# Patient Record
Sex: Female | Born: 1995 | Race: White | Hispanic: No | Marital: Married | Smoking: Never smoker
Health system: Southern US, Community
[De-identification: ages and names within clinical notes are randomized; demographics above are authoritative.]

## PROBLEM LIST (undated history)

## (undated) DIAGNOSIS — I10 Essential (primary) hypertension: Secondary | ICD-10-CM

## (undated) DIAGNOSIS — K219 Gastro-esophageal reflux disease without esophagitis: Secondary | ICD-10-CM

## (undated) HISTORY — PX: PANCREATECTOMY: SHX1019

## (undated) HISTORY — PX: CHOLECYSTECTOMY: SHX55

---
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
## 2018-01-23 ENCOUNTER — Ambulatory Visit (HOSPITAL_COMMUNITY)
Admission: EM | Admit: 2018-01-23 | Discharge: 2018-01-23 | Disposition: A | Discharge status: Home / Self Care | Attending: Internal Medicine | Admitting: Internal Medicine

## 2018-01-23 ENCOUNTER — Encounter (HOSPITAL_COMMUNITY): Payer: Self-pay | Admitting: Emergency Medicine

## 2018-01-23 DIAGNOSIS — K921 Melena: Secondary | ICD-10-CM

## 2018-01-23 DIAGNOSIS — R112 Nausea with vomiting, unspecified: Secondary | ICD-10-CM | POA: Diagnosis not present

## 2018-01-23 DIAGNOSIS — M7918 Myalgia, other site: Secondary | ICD-10-CM | POA: Diagnosis not present

## 2018-01-23 LAB — POCT I-STAT, CHEM 8
BUN: 15 mg/dL (ref 6–20)
CALCIUM ION: 1.28 mmol/L (ref 1.15–1.40)
Chloride: 108 mmol/L (ref 98–111)
Creatinine, Ser: 0.9 mg/dL (ref 0.44–1.00)
Glucose, Bld: 94 mg/dL (ref 70–99)
HCT: 41 % (ref 36.0–46.0)
Hemoglobin: 13.9 g/dL (ref 12.0–15.0)
Potassium: 3.2 mmol/L — ABNORMAL LOW (ref 3.5–5.1)
SODIUM: 139 mmol/L (ref 135–145)
TCO2: 23 mmol/L (ref 22–32)

## 2018-01-23 LAB — OCCULT BLOOD, POC DEVICE: FECAL OCCULT BLD: NEGATIVE

## 2018-01-23 MED ORDER — SODIUM CHLORIDE 0.9 % IV BOLUS
1000.0000 mL | Freq: Once | INTRAVENOUS | Status: AC
  Administered 2018-01-23: 1000 mL via INTRAVENOUS

## 2018-01-23 MED ORDER — ONDANSETRON HCL 4 MG/2ML IJ SOLN
4.0000 mg | Freq: Once | INTRAMUSCULAR | Status: AC
  Administered 2018-01-23: 4 mg via INTRAVENOUS

## 2018-01-23 MED ORDER — ONDANSETRON 4 MG PO TBDP: 4.0000 mg | ORAL_TABLET | Freq: Three times a day (TID) | ORAL | 0 refills | Status: AC | PRN

## 2018-01-23 MED ORDER — ONDANSETRON HCL 4 MG/2ML IJ SOLN
INTRAMUSCULAR | Status: AC
  Filled 2018-01-23: qty 2

## 2018-01-23 NOTE — ED Triage Notes (Signed)
Pt c/o cold/flu sx onset today around 0130  Sx include: body aches, chills, vomiting and nauseas, weakness, fevers  Denies cough, runny nose, congestion   Last had dayquil and acetaminophen at 0700 today.   Pt is breastfeeding 768 week old baby   A&O x4... NAD.Marland Kitchen.. ambulatory

## 2018-01-23 NOTE — Discharge Instructions (Addendum)
Your blood work was normal. Zofran for nausea and vomiting as needed. Keep hydrated, you urine should be clear to pale yellow in color. Bland diet, advance as tolerated. Monitor for any worsening of symptoms, nausea or vomiting not controlled by medication, abdominal pain, unwilling to jump due to abdominal pain, lightheaded, passing out, go to the emergency department for further evaluation.

## 2018-01-23 NOTE — ED Provider Notes (Signed)
[facility]    CSN: [id number] Arrival date & time: 01/23/18  0809     History   Chief Complaint Chief Complaint  Patient presents with  . URI    HPI Morgan Knight is a 22 y.o. female.   22 year old female comes in for acute onset of nausea, vomiting, subjective fever, body aches, chills. She had 2 episodes of nonbilious nonbloody vomit. Denies URI symptoms such as cough, congestion, rhinorrhea, sore throat. Denies abdominal pain, diarrhea. States for the past 1 week, has seen blood in the toilet bowl during BMs. Denies painful BM, melena. Mild straining during BM. She has not tolerated oral intake since last emesis episode. She was able to take dayquil and tylenol about 7am this morning, and kept the medications down. Patient breastfeeding 58 week old baby.   She denies chest pain, shortness of breath. Occasional palpitations since symptoms started. She has felt lightheaded without dizziness, one sided weakness, syncope.      History reviewed. No pertinent past medical history.  There are no active problems to display for this patient.   History reviewed. No pertinent surgical history.  OB History   No obstetric history on file.      Home Medications    Prior to Admission medications   Medication Sig Start Date End Date Taking? Authorizing Provider  ondansetron (ZOFRAN ODT) 4 MG disintegrating tablet Take 1 tablet (4 mg total) by mouth every 8 (eight) hours as needed for nausea or vomiting. 01/23/18   Belinda FisherYu, Amy V, PA-C    Family History History reviewed. No pertinent family history.  Social History Social History   Tobacco Use  . Smoking status: Never Smoker  . Smokeless tobacco: Never Used  Substance Use Topics  . Alcohol use: Never    Frequency: Never  . Drug use: Never     Allergies   Patient has no known allergies.   Review of Systems Review of Systems  Reason unable to perform ROS: See HPI as above.     Physical Exam Triage  Vital Signs ED Triage Vitals  Enc Vitals Group     BP 01/23/18 0839 (!) 99/58     Pulse Rate 01/23/18 0838 (!) 115     Resp --      Temp 01/23/18 0838 100.2 F (37.9 C)     Temp Source 01/23/18 0838 Oral     SpO2 01/23/18 0839 98 %     Weight --      Height --      Head Circumference --      Peak Flow --      Pain Score 01/23/18 0841 6     Pain Loc --      Pain Edu? --      Excl. in GC? --    No data found.  Updated Vital Signs BP (!) 105/54 (BP Location: Left Arm)   Pulse 97   Temp 98.8 F (37.1 C) (Oral)   Resp 16   LMP 03/10/2017   SpO2 97%    Physical Exam Constitutional:      General: She is not in acute distress.    Appearance: She is well-developed.  HENT:     Head: Normocephalic and atraumatic.     Right Ear: Tympanic membrane, ear canal and external ear normal. Tympanic membrane is not erythematous or bulging.     Left Ear: Tympanic membrane, ear canal and external ear normal. Tympanic membrane is not erythematous or bulging.  Nose: Nose normal.     Right Sinus: No maxillary sinus tenderness or frontal sinus tenderness.     Left Sinus: No maxillary sinus tenderness or frontal sinus tenderness.     Mouth/Throat:     Pharynx: Uvula midline.  Eyes:     Conjunctiva/sclera: Conjunctivae normal.     Pupils: Pupils are equal, round, and reactive to light.  Neck:     Musculoskeletal: Normal range of motion and neck supple.  Cardiovascular:     Rate and Rhythm: Regular rhythm. Tachycardia present.     Heart sounds: Normal heart sounds. No murmur. No friction rub. No gallop.   Pulmonary:     Effort: Pulmonary effort is normal. No respiratory distress.     Breath sounds: Normal breath sounds. No stridor. No decreased breath sounds, wheezing, rhonchi or rales.  Abdominal:     General: Bowel sounds are normal.     Palpations: Abdomen is soft.     Tenderness: There is no abdominal tenderness. There is no guarding or rebound.  Genitourinary:    Rectum: Normal.  Guaiac result negative. No tenderness, anal fissure or external hemorrhoid. Normal anal tone.  Lymphadenopathy:     Cervical: No cervical adenopathy.  Skin:    General: Skin is warm and dry.  Neurological:     Mental Status: She is alert and oriented to person, place, and time.  Psychiatric:        Behavior: Behavior normal.        Judgment: Judgment normal.      UC Treatments / Results  Labs (all labs ordered are listed, but only abnormal results are displayed) Labs Reviewed  POCT I-STAT, CHEM 8 - Abnormal; Notable for the following components:      Result Value   Potassium 3.2 (*)    All other components within normal limits  POC OCCULT BLOOD, ED  I-STAT CHEM 8, ED  OCCULT BLOOD, POC DEVICE    EKG None  Radiology No results found.  Procedures Procedures (including critical care time)  Medications Ordered in UC Medications  ondansetron (ZOFRAN) injection 4 mg (4 mg Intravenous Given 01/23/18 0920)  sodium chloride 0.9 % bolus 1,000 mL (1,000 mLs Intravenous New Bag/Given 01/23/18 0918)    Initial Impression / Assessment and Plan / UC Course  I have reviewed the triage vital signs and the nursing notes.  Pertinent labs & imaging results that were available during my care of the patient were reviewed by me and considered in my medical decision making (see chart for details).    Patient with improvement of nausea after zofran. Has not had any palpitations. Will continue fluids and recheck.   Patient with improved symptoms after bolus fluid.  Provide Zofran for nausea/vomiting.  Other symptomatic treatment discussed.  Push fluids.  Return precautions given.  Patient expresses understanding and agrees to plan.  Final Clinical Impressions(s) / UC Diagnoses   Final diagnoses:  Intractable vomiting with nausea, unspecified vomiting type    ED Prescriptions    Medication Sig Dispense Auth. Provider   ondansetron (ZOFRAN ODT) 4 MG disintegrating tablet Take 1 tablet  (4 mg total) by mouth every 8 (eight) hours as needed for nausea or vomiting. 20 tablet Threasa AlphaYu, Amy V, PA-C        Yu, Amy V, PA-C 01/23/18 1800

## 2021-04-12 ENCOUNTER — Encounter (HOSPITAL_COMMUNITY): Payer: Self-pay

## 2021-04-12 ENCOUNTER — Inpatient Hospital Stay (HOSPITAL_COMMUNITY)
Admission: AD | Admit: 2021-04-12 | Discharge: 2021-04-13 | Disposition: A | Discharge status: Home / Self Care | Attending: Obstetrics and Gynecology | Admitting: Obstetrics and Gynecology

## 2021-04-12 ENCOUNTER — Other Ambulatory Visit: Payer: Self-pay

## 2021-04-12 DIAGNOSIS — R1010 Upper abdominal pain, unspecified: Secondary | ICD-10-CM | POA: Diagnosis present

## 2021-04-12 DIAGNOSIS — O26893 Other specified pregnancy related conditions, third trimester: Secondary | ICD-10-CM | POA: Insufficient documentation

## 2021-04-12 DIAGNOSIS — Z3A35 35 weeks gestation of pregnancy: Secondary | ICD-10-CM | POA: Diagnosis not present

## 2021-04-12 DIAGNOSIS — R0781 Pleurodynia: Secondary | ICD-10-CM | POA: Diagnosis not present

## 2021-04-12 HISTORY — DX: Gastro-esophageal reflux disease without esophagitis: K21.9

## 2021-04-12 HISTORY — DX: Essential (primary) hypertension: I10

## 2021-04-12 LAB — URINALYSIS, ROUTINE W REFLEX MICROSCOPIC
Bilirubin Urine: NEGATIVE
Glucose, UA: 150 mg/dL — AB
Hgb urine dipstick: NEGATIVE
Ketones, ur: NEGATIVE mg/dL
Nitrite: NEGATIVE
Protein, ur: NEGATIVE mg/dL
Specific Gravity, Urine: 1.02 (ref 1.005–1.030)
pH: 6 (ref 5.0–8.0)

## 2021-04-12 LAB — CBC WITH DIFFERENTIAL/PLATELET
Abs Immature Granulocytes: 0.06 10*3/uL (ref 0.00–0.07)
Basophils Absolute: 0 10*3/uL (ref 0.0–0.1)
Basophils Relative: 0 %
Eosinophils Absolute: 0.1 10*3/uL (ref 0.0–0.5)
Eosinophils Relative: 1 %
HCT: 33.1 % — ABNORMAL LOW (ref 36.0–46.0)
Hemoglobin: 11.2 g/dL — ABNORMAL LOW (ref 12.0–15.0)
Immature Granulocytes: 1 %
Lymphocytes Relative: 23 %
Lymphs Abs: 1.7 10*3/uL (ref 0.7–4.0)
MCH: 30.2 pg (ref 26.0–34.0)
MCHC: 33.8 g/dL (ref 30.0–36.0)
MCV: 89.2 fL (ref 80.0–100.0)
Monocytes Absolute: 0.6 10*3/uL (ref 0.1–1.0)
Monocytes Relative: 8 %
Neutro Abs: 5 10*3/uL (ref 1.7–7.7)
Neutrophils Relative %: 67 %
Platelets: 230 10*3/uL (ref 150–400)
RBC: 3.71 MIL/uL — ABNORMAL LOW (ref 3.87–5.11)
RDW: 12.2 % (ref 11.5–15.5)
WBC: 7.5 10*3/uL (ref 4.0–10.5)
nRBC: 0 % (ref 0.0–0.2)

## 2021-04-12 MED ORDER — CYCLOBENZAPRINE HCL 5 MG PO TABS
10.0000 mg | ORAL_TABLET | Freq: Once | ORAL | Status: AC
  Administered 2021-04-12: 10 mg via ORAL
  Filled 2021-04-12: qty 2

## 2021-04-12 NOTE — MAU Note (Signed)
Morgan Knight Hildred Alamin is a 26 y.o. at Unknown here in MAU reporting: hx of cholestasis in previous pregnancy. States she has been itching on her stomach for about a week now, but it got worse today along with rib pain that has been on and off for a week, but more constant today "like a stick is poking me in my rib cage". Reports "spotty" vision for the past couple of hours. States baby's movement has decreased to twice a day over the past week as well. Denies VB or LOF.  ? ? EDD 05/12/2021, repeat c-section scheduled on 3/31.  ? ?Onset of complaint: 1 week  ?Pain score: 8 ?Vitals:  ? 04/12/21 2240  ?BP: 133/80  ?Pulse: 85  ?Resp: 18  ?Temp: 98.1 ?F (36.7 ?C)  ?SpO2: 99%  ?   ?FHT:147 ?Lab orders placed from triage: U/A ? ?

## 2021-04-12 NOTE — MAU Provider Note (Signed)
?History  ?  ? ?CSN: [id number] ? ?Arrival date and time: 04/12/21 2232 ? ? Event Date/Time  ? First Provider Initiated Contact with Patient 04/12/21 2312   ?  ? ?Chief Complaint  ?Patient presents with  ? Abdominal Pain  ?   ?  ? ?HPI ?Morgan Knight is a 26 y.o. G2P1 at [redacted]w[redacted]d who presents with right sided upper abdominal pain. She reports the pain has been going on for about a week. She rates the pain a 8/10 and has tried tylenol and massage with no relief. She reports nausea and diarrhea for 2 days. She denies any vomiting. Denies any sick contacts that she knows of. She denies any contractions, bleeding or leaking. Reports feeling normal fetal movement.  ? ?OB History   ? ? Gravida  ?2  ? Para  ?1  ? Term  ?1  ? Preterm  ?   ? AB  ?   ? Living  ?1  ?  ? ? SAB  ?   ? IAB  ?   ? Ectopic  ?   ? Multiple  ?   ? Live Births  ?1  ?   ?  ?  ? ? ?Past Medical History:  ?Diagnosis Date  ? GERD (gastroesophageal reflux disease)   ? Hypertension   ? ? ?Past Surgical History:  ?Procedure Laterality Date  ? CESAREAN SECTION    ? CHOLECYSTECTOMY    ? ? ?No family history on file. ? ?Social History  ? ?Tobacco Use  ? Smoking status: Never  ? Smokeless tobacco: Never  ?Substance Use Topics  ? Alcohol use: Never  ? Drug use: Never  ? ? ?Allergies: No Known Allergies ? ?Medications Prior to Admission  ?Medication Sig Dispense Refill Last Dose  ? doxylamine, Sleep, (UNISOM) 25 MG tablet Take 25 mg by mouth at bedtime as needed.   04/12/2021  ? Prenatal Vit-Fe Fumarate-FA (PRENATAL MULTIVITAMIN) TABS tablet Take 1 tablet by mouth daily at 12 noon.   04/12/2021  ? ondansetron (ZOFRAN ODT) 4 MG disintegrating tablet Take 1 tablet (4 mg total) by mouth every 8 (eight) hours as needed for nausea or vomiting. 20 tablet 0   ? ? ?Review of Systems  ?Constitutional: Negative.  Negative for fatigue and fever.  ?HENT: Negative.    ?Respiratory: Negative.  Negative for shortness of breath.   ?Cardiovascular: Negative.  Negative for  chest pain.  ?Gastrointestinal:  Positive for abdominal pain. Negative for constipation, diarrhea, nausea and vomiting.  ?Genitourinary: Negative.  Negative for dysuria, vaginal bleeding and vaginal discharge.  ?Neurological: Negative.  Negative for dizziness and headaches.  ?Physical Exam  ? ?Blood pressure 124/84, pulse 87, temperature 98.1 ?F (36.7 ?C), temperature source Oral, resp. rate 18, height 5\' 6"  (1.676 m), weight 85.3 kg, SpO2 99 %. ? ?Physical Exam ?Vitals and nursing note reviewed.  ?Constitutional:   ?   General: She is not in acute distress. ?   Appearance: She is well-developed.  ?HENT:  ?   Head: Normocephalic.  ?Eyes:  ?   Pupils: Pupils are equal, round, and reactive to light.  ?Cardiovascular:  ?   Rate and Rhythm: Normal rate and regular rhythm.  ?   Heart sounds: Normal heart sounds.  ?Pulmonary:  ?   Effort: Pulmonary effort is normal. No respiratory distress.  ?   Breath sounds: Normal breath sounds.  ?Abdominal:  ?   General: Bowel sounds are normal. There is no distension.  ?  Palpations: Abdomen is soft.  ?   Tenderness: There is no abdominal tenderness.  ?Skin: ?   General: Skin is warm and dry.  ?Neurological:  ?   Mental Status: She is alert and oriented to person, place, and time.  ?Psychiatric:     ?   Mood and Affect: Mood normal.     ?   Behavior: Behavior normal.     ?   Thought Content: Thought content normal.     ?   Judgment: Judgment normal.  ? ?Fetal Tracing: ? ?Baseline: 130 ?Variability: moderate ?Accels: 15x15 ?Decels: none ? ?Toco: none ? ? ?MAU Course  ?Procedures ?Results for orders placed or performed during the hospital encounter of 04/12/21 (from the past 24 hour(s))  ?Urinalysis, Routine w reflex microscopic Urine, Clean Catch     Status: Abnormal  ? Collection Time: 04/12/21 10:57 PM  ?Result Value Ref Range  ? Color, Urine YELLOW YELLOW  ? APPearance HAZY (A) CLEAR  ? Specific Gravity, Urine 1.020 1.005 - 1.030  ? pH 6.0 5.0 - 8.0  ? Glucose, UA 150 (A) NEGATIVE  mg/dL  ? Hgb urine dipstick NEGATIVE NEGATIVE  ? Bilirubin Urine NEGATIVE NEGATIVE  ? Ketones, ur NEGATIVE NEGATIVE mg/dL  ? Protein, ur NEGATIVE NEGATIVE mg/dL  ? Nitrite NEGATIVE NEGATIVE  ? Leukocytes,Ua LARGE (A) NEGATIVE  ? RBC / HPF 0-5 0 - 5 RBC/hpf  ? WBC, UA 21-50 0 - 5 WBC/hpf  ? Bacteria, UA RARE (A) NONE SEEN  ? Squamous Epithelial / LPF 11-20 0 - 5  ? Mucus PRESENT   ? Ca Oxalate Crys, UA PRESENT   ?CBC with Differential/Platelet     Status: Abnormal  ? Collection Time: 04/12/21 11:26 PM  ?Result Value Ref Range  ? WBC 7.5 4.0 - 10.5 K/uL  ? RBC 3.71 (L) 3.87 - 5.11 MIL/uL  ? Hemoglobin 11.2 (L) 12.0 - 15.0 g/dL  ? HCT 33.1 (L) 36.0 - 46.0 %  ? MCV 89.2 80.0 - 100.0 fL  ? MCH 30.2 26.0 - 34.0 pg  ? MCHC 33.8 30.0 - 36.0 g/dL  ? RDW 12.2 11.5 - 15.5 %  ? Platelets 230 150 - 400 K/uL  ? nRBC 0.0 0.0 - 0.2 %  ? Neutrophils Relative % 67 %  ? Neutro Abs 5.0 1.7 - 7.7 K/uL  ? Lymphocytes Relative 23 %  ? Lymphs Abs 1.7 0.7 - 4.0 K/uL  ? Monocytes Relative 8 %  ? Monocytes Absolute 0.6 0.1 - 1.0 K/uL  ? Eosinophils Relative 1 %  ? Eosinophils Absolute 0.1 0.0 - 0.5 K/uL  ? Basophils Relative 0 %  ? Basophils Absolute 0.0 0.0 - 0.1 K/uL  ? Immature Granulocytes 1 %  ? Abs Immature Granulocytes 0.06 0.00 - 0.07 K/uL  ?Comprehensive metabolic panel     Status: Abnormal  ? Collection Time: 04/12/21 11:26 PM  ?Result Value Ref Range  ? Sodium 136 135 - 145 mmol/L  ? Potassium 3.6 3.5 - 5.1 mmol/L  ? Chloride 109 98 - 111 mmol/L  ? CO2 21 (L) 22 - 32 mmol/L  ? Glucose, Bld 124 (H) 70 - 99 mg/dL  ? BUN <5 (L) 6 - 20 mg/dL  ? Creatinine, Ser 0.62 0.44 - 1.00 mg/dL  ? Calcium 8.1 (L) 8.9 - 10.3 mg/dL  ? Total Protein 5.4 (L) 6.5 - 8.1 g/dL  ? Albumin 2.4 (L) 3.5 - 5.0 g/dL  ? AST 19 15 - 41 U/L  ? ALT 15  0 - 44 U/L  ? Alkaline Phosphatase 142 (H) 38 - 126 U/L  ? Total Bilirubin 0.2 (L) 0.3 - 1.2 mg/dL  ? GFR, Estimated >60 >60 mL/min  ? Anion gap 6 5 - 15  ?  ? ?MDM ?Prenatal records from private office reviewed-  limited [facility] available from [facility]. Pregnancy complicated by [facility], previous c/s. ?Labs ordered and reviewed. ? ?UA, UC ?CBC with Diff ?CMP ?Flexeril PO- patient reports resolution of pain ? ?Assessment and Plan  ? ?1. Rib pain   ?2. [redacted] weeks gestation of pregnancy   ? ?-Discharge home in stable condition ?-Rx for flexeril sent to patient's pharmacy ?-Preterm labor precautions discussed ?-Patient advised to follow-up with OB as scheduled for prenatal care ?-Patient may return to MAU as needed or if her condition were to change or worsen ? ? ?Wende Mott CNM ?04/12/2021, 11:32 PM  ?

## 2021-04-13 DIAGNOSIS — Z3A35 35 weeks gestation of pregnancy: Secondary | ICD-10-CM

## 2021-04-13 DIAGNOSIS — R0781 Pleurodynia: Secondary | ICD-10-CM

## 2021-04-13 LAB — COMPREHENSIVE METABOLIC PANEL
ALT: 15 U/L (ref 0–44)
AST: 19 U/L (ref 15–41)
Albumin: 2.4 g/dL — ABNORMAL LOW (ref 3.5–5.0)
Alkaline Phosphatase: 142 U/L — ABNORMAL HIGH (ref 38–126)
Anion gap: 6 (ref 5–15)
BUN: <5 mg/dL — ABNORMAL LOW (ref 6–20)
CO2: 21 mmol/L — ABNORMAL LOW (ref 22–32)
Calcium: 8.1 mg/dL — ABNORMAL LOW (ref 8.9–10.3)
Chloride: 109 mmol/L (ref 98–111)
Creatinine, Ser: 0.62 mg/dL (ref 0.44–1.00)
GFR, Estimated: >60 mL/min (ref >60)
Glucose, Bld: 124 mg/dL — ABNORMAL HIGH (ref 70–99)
Potassium: 3.6 mmol/L (ref 3.5–5.1)
Sodium: 136 mmol/L (ref 135–145)
Total Bilirubin: 0.2 mg/dL — ABNORMAL LOW (ref 0.3–1.2)
Total Protein: 5.4 g/dL — ABNORMAL LOW (ref 6.5–8.1)

## 2021-04-13 MED ORDER — CYCLOBENZAPRINE HCL 10 MG PO TABS: 10.0000 mg | ORAL_TABLET | Freq: Two times a day (BID) | ORAL | 0 refills | Status: AC | PRN

## 2021-04-13 NOTE — Discharge Instructions (Signed)

## 2021-04-14 LAB — CULTURE, OB URINE: Culture: 5000 — AB

## 2021-05-15 ENCOUNTER — Ambulatory Visit
Admission: EM | Admit: 2021-05-15 | Discharge: 2021-05-15 | Disposition: A | Discharge status: Home / Self Care | Attending: Physician Assistant | Admitting: Physician Assistant

## 2021-05-15 DIAGNOSIS — J020 Streptococcal pharyngitis: Secondary | ICD-10-CM

## 2021-05-15 LAB — POCT RAPID STREP A (OFFICE): Rapid Strep A Screen: POSITIVE — AB

## 2021-05-15 MED ORDER — AMOXICILLIN 500 MG PO CAPS: 500.0000 mg | ORAL_CAPSULE | Freq: Three times a day (TID) | ORAL | 0 refills | Status: DC

## 2021-05-15 NOTE — ED Triage Notes (Addendum)
Pt presents with complaints of headache since last night, fever, hip pain (describe as bodyache) and right calf cramping. Pt just had a baby vaginally 2 weeks ago with epidural. Pt had fever of 102.3 at home. Took tylenol, fever is down but headache is the same.  ?

## 2021-05-15 NOTE — ED Provider Notes (Signed)
?[facility] ? ? ? ?CSN: [id number] ?Arrival date & time: 05/15/21  1735 ? ? ?  ? ?History   ?Chief Complaint ?Chief Complaint  ?Patient presents with  ? Headache  ? ? ?HPI ?Tyrianna Lightle Kyung Rudd is a 26 y.o. female.  ? ?Patient here today for evaluation of headache, fever, body aches that started last night.  She has had fever with Tmax of 102.3 Fahrenheit.  She has taken over-the-counter medication with mild relief.  She did have vaginal delivery 2 weeks ago.  Husband notes that he had sore throat around that time.  His sore throat has resolved. ? ?The history is provided by the patient.  ?Headache ?Associated symptoms: fever and myalgias   ?Associated symptoms: no abdominal pain, no congestion, no nausea, no sore throat and no vomiting   ? ?Past Medical History:  ?Diagnosis Date  ? GERD (gastroesophageal reflux disease)   ? Hypertension   ? ? ?There are no problems to display for this patient. ? ? ?Past Surgical History:  ?Procedure Laterality Date  ? CESAREAN SECTION    ? CHOLECYSTECTOMY    ? PANCREATECTOMY    ? partial  ? ? ?OB History   ? ? Gravida  ?2  ? Para  ?1  ? Term  ?1  ? Preterm  ?   ? AB  ?   ? Living  ?1  ?  ? ? SAB  ?   ? IAB  ?   ? Ectopic  ?   ? Multiple  ?   ? Live Births  ?1  ?   ?  ?  ? ? ? ?Home Medications   ? ?Prior to Admission medications   ?Medication Sig Start Date End Date Taking? Authorizing Provider  ?amoxicillin (AMOXIL) 500 MG capsule Take 1 capsule (500 mg total) by mouth 3 (three) times daily. 05/15/21  Yes Tomi Bamberger, PA-C  ?cyclobenzaprine (FLEXERIL) 10 MG tablet Take 1 tablet (10 mg total) by mouth 2 (two) times daily as needed for muscle spasms. 04/13/21   Rolm Bookbinder, CNM  ?doxylamine, Sleep, (UNISOM) 25 MG tablet Take 25 mg by mouth at bedtime as needed.    [provider]  ?ondansetron (ZOFRAN ODT) 4 MG disintegrating tablet Take 1 tablet (4 mg total) by mouth every 8 (eight) hours as needed for nausea or vomiting. 01/23/18   Belinda Fisher,  PA-C  ?Prenatal Vit-Fe Fumarate-FA (PRENATAL MULTIVITAMIN) TABS tablet Take 1 tablet by mouth daily at 12 noon.    [provider]  ? ? ?Family History ?Family History  ?Problem Relation Age of Onset  ? Healthy Mother   ? Healthy Father   ? ? ?Social History ?Social History  ? ?Tobacco Use  ? Smoking status: Never  ? Smokeless tobacco: Never  ?Substance Use Topics  ? Alcohol use: Never  ? Drug use: Never  ? ? ? ?Allergies   ?Patient has no known allergies. ? ? ?Review of Systems ?Review of Systems  ?Constitutional:  Positive for fever.  ?HENT:  Negative for congestion and sore throat.   ?Eyes:  Negative for discharge and redness.  ?Respiratory:  Negative for shortness of breath.   ?Gastrointestinal:  Negative for abdominal pain, nausea and vomiting.  ?Musculoskeletal:  Positive for myalgias.  ?Neurological:  Positive for headaches.  ? ? ?Physical Exam ?Triage Vital Signs ?ED Triage Vitals  ?Enc Vitals Group  ?   BP 05/15/21 1741 109/79  ?   Pulse Rate 05/15/21 1741 Marland Kitchen)  108  ?   Resp 05/15/21 1741 19  ?   Temp 05/15/21 1741 99.9 ?F (37.7 ?C)  ?   Temp src --   ?   SpO2 05/15/21 1741 100 %  ?   Weight --   ?   Height --   ?   Head Circumference --   ?   Peak Flow --   ?   Pain Score 05/15/21 1740 8  ?   Pain Loc --   ?   Pain Edu? --   ?   Excl. in [place]? --   ? ?No data found. ? ?Updated Vital Signs ?BP 109/79   Pulse (!) 108   Temp 99.9 ?F (37.7 ?C)   Resp 19   SpO2 100%   Breastfeeding Yes  ?   ? ?Physical Exam ?Vitals and nursing note reviewed.  ?Constitutional:   ?   General: She is not in acute distress. ?   Appearance: Normal appearance. She is not ill-appearing.  ?HENT:  ?   Head: Normocephalic and atraumatic.  ?Eyes:  ?   Conjunctiva/sclera: Conjunctivae normal.  ?Cardiovascular:  ?   Rate and Rhythm: Normal rate.  ?Pulmonary:  ?   Effort: Pulmonary effort is normal. No respiratory distress.  ?Neurological:  ?   Mental Status: She is alert.  ?Psychiatric:     ?   Mood and Affect: Mood normal.     ?    Behavior: Behavior normal.     ?   Thought Content: Thought content normal.  ? ? ? ?UC Treatments / Results  ?Labs ?(all labs ordered are listed, but only abnormal results are displayed) ?Labs Reviewed  ?POCT RAPID STREP A (OFFICE) - Abnormal; Notable for the following components:  ?    Result Value  ? Rapid Strep A Screen Positive (*)   ? All other components within normal limits  ? ? ?EKG ? ? ?Radiology ?No results found. ? ?Procedures ?Procedures (including critical care time) ? ?Medications Ordered in UC ?Medications - No data to display ? ?Initial Impression / Assessment and Plan / UC Course  ?I have reviewed the triage vital signs and the nursing notes. ? ?Pertinent labs & imaging results that were available during my care of the patient were reviewed by me and considered in my medical decision making (see chart for details). ? ?  ?Strep test positive in office.  Will treat with antibiotics and encouraged follow-up with any further concerns.  Discussed evaluation in the emergency department with any worsening symptoms. ? ?Final Clinical Impressions(s) / UC Diagnoses  ? ?Final diagnoses:  ?Strep pharyngitis  ? ?Discharge Instructions   ?None ?  ? ?ED Prescriptions   ? ? Medication Sig Dispense Auth. Provider  ? amoxicillin (AMOXIL) 500 MG capsule Take 1 capsule (500 mg total) by mouth 3 (three) times daily. 21 capsule Tomi Bamberger, PA-C  ? ?  ? ?PDMP not reviewed this encounter. ?  ?Tomi Bamberger, PA-C ?05/15/21 1817 ? ?

## 2022-04-22 ENCOUNTER — Ambulatory Visit
Admission: EM | Admit: 2022-04-22 | Discharge: 2022-04-22 | Disposition: A | Discharge status: Home / Self Care | Payer: Medicaid Other | Attending: Physician Assistant | Admitting: Physician Assistant

## 2022-04-22 ENCOUNTER — Encounter: Payer: Self-pay | Admitting: Emergency Medicine

## 2022-04-22 DIAGNOSIS — J0110 Acute frontal sinusitis, unspecified: Secondary | ICD-10-CM

## 2022-04-22 DIAGNOSIS — J069 Acute upper respiratory infection, unspecified: Secondary | ICD-10-CM

## 2022-04-22 DIAGNOSIS — J029 Acute pharyngitis, unspecified: Secondary | ICD-10-CM | POA: Diagnosis not present

## 2022-04-22 LAB — POCT RAPID STREP A (OFFICE): Rapid Strep A Screen: NEGATIVE

## 2022-04-22 MED ORDER — FLUTICASONE PROPIONATE 50 MCG/ACT NA SUSP: 2.0000 | Freq: Every day | NASAL | 0 refills | Status: AC

## 2022-04-22 MED ORDER — AMOXICILLIN 500 MG PO CAPS: 500.0000 mg | ORAL_CAPSULE | Freq: Three times a day (TID) | ORAL | 0 refills | Status: DC

## 2022-04-22 NOTE — ED Triage Notes (Signed)
Pt presents with head congestion, ear pain. And sore throat Xs 1 day. States has taken OTC pain medication with minimal relief.

## 2022-04-22 NOTE — ED Provider Notes (Signed)
[facility]    CSN: [id number] Arrival date & time: 04/22/22  0850      History   Chief Complaint Chief Complaint  Patient presents with   Nasal Congestion   Sore Throat    HPI Morgan Knight is a 27 y.o. female.   27 year old female presents with sore throat, sinus congestion.  Patient indicates for the past couple days she has been having increasing frontal sinus pain, pressure, tenderness with mild postnasal drip patient indicates production has been purulent and green.  Patient also indicates she has having right ear pain, pressure and discomfort for the past 1 to 2 days.  She also indicates having sore throat and painful swallowing which has been present for the past 24 hours.  She is without fever or chills.  She indicates she has not been around any family, friends or coworkers have been sick.  She wants to make sure that she is not contagious as for Her 13-year-old daughter today.  She is tolerating fluids well.   Sore Throat    Past Medical History:  Diagnosis Date   GERD (gastroesophageal reflux disease)    Hypertension     There are no problems to display for this patient.   Past Surgical History:  Procedure Laterality Date   CESAREAN SECTION     CHOLECYSTECTOMY     PANCREATECTOMY     partial    OB History     Gravida  2   Para  1   Term  1   Preterm      AB      Living  1      SAB      IAB      Ectopic      Multiple      Live Births  1            Home Medications    Prior to Admission medications   Medication Sig Start Date End Date Taking? Authorizing Provider  fluticasone (FLONASE) 50 MCG/ACT nasal spray Place 2 sprays into both nostrils daily. 04/22/22  Yes Nyoka Lint, PA-C  amoxicillin (AMOXIL) 500 MG capsule Take 1 capsule (500 mg total) by mouth 3 (three) times daily. 04/22/22   Nyoka Lint, PA-C  cyclobenzaprine (FLEXERIL) 10 MG tablet Take 1 tablet (10 mg total) by mouth 2 (two) times daily as  needed for muscle spasms. 04/13/21   Wende Mott, CNM  doxylamine, Sleep, (UNISOM) 25 MG tablet Take 25 mg by mouth at bedtime as needed.    [provider]  ondansetron (ZOFRAN ODT) 4 MG disintegrating tablet Take 1 tablet (4 mg total) by mouth every 8 (eight) hours as needed for nausea or vomiting. 01/23/18   Ok Edwards, PA-C  Prenatal Vit-Fe Fumarate-FA (PRENATAL MULTIVITAMIN) TABS tablet Take 1 tablet by mouth daily at 12 noon.    [provider]    Family History Family History  Problem Relation Age of Onset   Healthy Mother    Healthy Father     Social History Social History   Tobacco Use   Smoking status: Never   Smokeless tobacco: Never  Substance Use Topics   Alcohol use: Never   Drug use: Never     Allergies   Patient has no known allergies.   Review of Systems Review of Systems  HENT:  Positive for ear pain (right), sinus pressure and sinus pain (frontal).      Physical Exam Triage Vital Signs ED Triage  Vitals  Enc Vitals Group     BP 04/22/22 0906 110/74     Pulse Rate 04/22/22 0906 83     Resp 04/22/22 0906 16     Temp 04/22/22 0906 98.3 F (36.8 C)     Temp Source 04/22/22 0906 Oral     SpO2 04/22/22 0906 98 %     Weight --      Height --      Head Circumference --      Peak Flow --      Pain Score 04/22/22 0905 7     Pain Loc --      Pain Edu? --      Excl. in [place]? --    No data found.  Updated Vital Signs BP 110/74 (BP Location: Left Arm)   Pulse 83   Temp 98.3 F (36.8 C) (Oral)   Resp 16   LMP 04/06/2022   SpO2 98%   Visual Acuity Right Eye Distance:   Left Eye Distance:   Bilateral Distance:    Right Eye Near:   Left Eye Near:    Bilateral Near:     Physical Exam Constitutional:      Appearance: She is well-developed.  HENT:     Right Ear: Ear canal normal. Tympanic membrane is erythematous.     Left Ear: Ear canal normal. Tympanic membrane is injected.     Mouth/Throat:     Mouth: Mucous  membranes are moist.     Pharynx: Posterior oropharyngeal erythema present. No oropharyngeal exudate.  Cardiovascular:     Rate and Rhythm: Normal rate and regular rhythm.     Heart sounds: Normal heart sounds.  Pulmonary:     Effort: Pulmonary effort is normal.     Breath sounds: Normal breath sounds and air entry. No wheezing, rhonchi or rales.  Lymphadenopathy:     Cervical: Cervical adenopathy (mild frontal adenopathy present bilat) present.  Neurological:     Mental Status: She is alert.      UC Treatments / Results  Labs (all labs ordered are listed, but only abnormal results are displayed) Labs Reviewed  POCT RAPID STREP A (OFFICE)    EKG   Radiology No results found.  Procedures Procedures (including critical care time)  Medications Ordered in UC Medications - No data to display  Initial Impression / Assessment and Plan / UC Course  I have reviewed the triage vital signs and the nursing notes.  Pertinent labs & imaging results that were available during my care of the patient were reviewed by me and considered in my medical decision making (see chart for details).    Plan: The diagnosis be treated with the following: 1.  Upper respiratory infection: A.  Flonase nasal spray, 2 sprays each nostril once a day to help decrease sinus congestion. 2.  Sore throat: A.  Advised take ibuprofen or Tylenol to help soothe the throat pain along with lozenges and gargles. 3.  Frontal sinusitis: A.  Advised take amoxicillin 500 mg every 8 hours to treat sinuses and the ear infection. 4.  Advised follow-up PCP or return to urgent care as needed. Final Clinical Impressions(s) / UC Diagnoses   Final diagnoses:  Acute upper respiratory infection  Sore throat  Acute non-recurrent frontal sinusitis     Discharge Instructions      Advised take ibuprofen or Tylenol as needed for pain relief from the sinuses and throat. Advised to use Flonase nasal spray, 2 sprays each  nostril  once a day to help decrease sinus pressure and drainage. Advised take amoxicillin 500 mg 3 times a day to treat the sinus and ear infection.  Advised follow-up PCP return to urgent care as needed.    ED Prescriptions     Medication Sig Dispense Auth. Provider   amoxicillin (AMOXIL) 500 MG capsule Take 1 capsule (500 mg total) by mouth 3 (three) times daily. 21 capsule Nyoka Lint, PA-C   fluticasone [facility]) 50 MCG/ACT nasal spray Place 2 sprays into both nostrils daily. 9.9 mL Nyoka Lint, PA-C      PDMP not reviewed this encounter.   Nyoka Lint, PA-C 04/22/22 [phone]

## 2022-04-22 NOTE — Discharge Instructions (Addendum)
Advised take ibuprofen or Tylenol as needed for pain relief from the sinuses and throat. Advised to use Flonase nasal spray, 2 sprays each nostril once a day to help decrease sinus pressure and drainage. Advised take amoxicillin 500 mg 3 times a day to treat the sinus and ear infection.  Advised follow-up PCP return to urgent care as needed.

## 2022-08-22 ENCOUNTER — Ambulatory Visit
Admission: EM | Admit: 2022-08-22 | Discharge: 2022-08-22 | Disposition: A | Discharge status: Home / Self Care | Payer: Medicaid Other | Attending: Internal Medicine | Admitting: Internal Medicine

## 2022-08-22 DIAGNOSIS — N3001 Acute cystitis with hematuria: Secondary | ICD-10-CM | POA: Diagnosis present

## 2022-08-22 DIAGNOSIS — R300 Dysuria: Secondary | ICD-10-CM | POA: Insufficient documentation

## 2022-08-22 DIAGNOSIS — Z3202 Encounter for pregnancy test, result negative: Secondary | ICD-10-CM | POA: Diagnosis not present

## 2022-08-22 LAB — POCT URINALYSIS DIP (MANUAL ENTRY)
Bilirubin, UA: NEGATIVE
Glucose, UA: NEGATIVE mg/dL
Ketones, POC UA: NEGATIVE mg/dL
Nitrite, UA: NEGATIVE
Spec Grav, UA: 1.02 (ref 1.010–1.025)
Urobilinogen, UA: 0.2 E.U./dL
pH, UA: 7.5 (ref 5.0–8.0)

## 2022-08-22 LAB — POCT URINE PREGNANCY: Preg Test, Ur: NEGATIVE

## 2022-08-22 MED ORDER — NITROFURANTOIN MONOHYD MACRO 100 MG PO CAPS: 100.0000 mg | ORAL_CAPSULE | Freq: Two times a day (BID) | ORAL | 0 refills | Status: AC

## 2022-08-22 MED ORDER — FLUCONAZOLE 150 MG PO TABS: 150.0000 mg | ORAL_TABLET | Freq: Every day | ORAL | 0 refills | Status: AC

## 2022-08-22 NOTE — Discharge Instructions (Signed)
I have prescribed you an antibiotic for UTI.  Urine culture is pending.  We will call when it results.  Follow-up if any symptoms persist or worsen.

## 2022-08-22 NOTE — ED Triage Notes (Signed)
Pt c/o possible UTI, pt states she feels like her bladder is always full, when she goes to the restroom she does not feel relief, at the end of urination starts burning. Back pain, x 1 day.

## 2022-08-22 NOTE — ED Provider Notes (Signed)
[facility]    CSN: [id number] Arrival date & time: 08/22/22  0957      History   Chief Complaint No chief complaint on file.   HPI Morgan Knight is a 27 y.o. female.   Patient presents with bladder pressure, feeling of incomplete emptying of bladder, dysuria that started about 24 hours ago.  Patient reports that she is having upper back pain as well but is not sure if this is related.  Denies vaginal discharge, abnormal vaginal bleeding, lower back pain, fever, chills.  Denies any exposure to STD.  Last menstrual cycle was approximately 3 weeks ago.     Past Medical History:  Diagnosis Date   GERD (gastroesophageal reflux disease)    Hypertension     There are no problems to display for this patient.   Past Surgical History:  Procedure Laterality Date   CESAREAN SECTION     CHOLECYSTECTOMY     PANCREATECTOMY     partial    OB History     Gravida  2   Para  1   Term  1   Preterm      AB      Living  1      SAB      IAB      Ectopic      Multiple      Live Births  1            Home Medications    Prior to Admission medications   Medication Sig Start Date End Date Taking? Authorizing Provider  fluconazole (DIFLUCAN) 150 MG tablet Take 1 tablet (150 mg total) by mouth daily. Take at first sign of vaginal yeast. 08/22/22  Yes Leilani Cespedes, Acie Fredrickson, FNP  nitrofurantoin, macrocrystal-monohydrate, (MACROBID) 100 MG capsule Take 1 capsule (100 mg total) by mouth 2 (two) times daily. 08/22/22  Yes Deantre Bourdon, Acie Fredrickson, FNP  cyclobenzaprine (FLEXERIL) 10 MG tablet Take 1 tablet (10 mg total) by mouth 2 (two) times daily as needed for muscle spasms. 04/13/21   Rolm Bookbinder, CNM  doxylamine, Sleep, (UNISOM) 25 MG tablet Take 25 mg by mouth at bedtime as needed.    [provider]  fluticasone (FLONASE) 50 MCG/ACT nasal spray Place 2 sprays into both nostrils daily. 04/22/22   Ellsworth Lennox, PA-C  ondansetron (ZOFRAN ODT) 4 MG  disintegrating tablet Take 1 tablet (4 mg total) by mouth every 8 (eight) hours as needed for nausea or vomiting. 01/23/18   Belinda Fisher, PA-C  Prenatal Vit-Fe Fumarate-FA (PRENATAL MULTIVITAMIN) TABS tablet Take 1 tablet by mouth daily at 12 noon.    [provider]    Family History Family History  Problem Relation Age of Onset   Healthy Mother    Healthy Father     Social History Social History   Tobacco Use   Smoking status: Never   Smokeless tobacco: Never  Substance Use Topics   Alcohol use: Never   Drug use: Never     Allergies   Patient has no known allergies.   Review of Systems Review of Systems Per HPI  Physical Exam Triage Vital Signs ED Triage Vitals  Encounter Vitals Group     BP 08/22/22 1017 123/83     Systolic BP Percentile --      Diastolic BP Percentile --      Pulse Rate 08/22/22 1017 62     Resp 08/22/22 1017 15     Temp 08/22/22 1017 98.3  F (36.8 C)     Temp Source 08/22/22 1017 Oral     SpO2 08/22/22 1017 97 %     Weight --      Height --      Head Circumference --      Peak Flow --      Pain Score 08/22/22 1015 0     Pain Loc --      Pain Education --      Exclude from Growth Chart --    No data found.  Updated Vital Signs BP 123/83 (BP Location: Left Arm)   Pulse 62   Temp 98.3 F (36.8 C) (Oral)   Resp 15   LMP 07/24/2022 (Within Days)   SpO2 97%   Breastfeeding No   Visual Acuity Right Eye Distance:   Left Eye Distance:   Bilateral Distance:    Right Eye Near:   Left Eye Near:    Bilateral Near:     Physical Exam Constitutional:      General: She is not in acute distress.    Appearance: Normal appearance. She is not toxic-appearing or diaphoretic.  HENT:     Head: Normocephalic and atraumatic.  Eyes:     Extraocular Movements: Extraocular movements intact.     Conjunctiva/sclera: Conjunctivae normal.  Pulmonary:     Effort: Pulmonary effort is normal.  Neurological:     General: No focal deficit  present.     Mental Status: She is alert and oriented to person, place, and time. Mental status is at baseline.  Psychiatric:        Mood and Affect: Mood normal.        Behavior: Behavior normal.        Thought Content: Thought content normal.        Judgment: Judgment normal.      UC Treatments / Results  Labs (all labs ordered are listed, but only abnormal results are displayed) Labs Reviewed  POCT URINALYSIS DIP (MANUAL ENTRY) - Abnormal; Notable for the following components:      Result Value   Clarity, UA hazy (*)    Blood, UA trace-lysed (*)    Protein Ur, POC trace (*)    Leukocytes, UA Moderate (2+) (*)    All other components within normal limits  URINE CULTURE  POCT URINE PREGNANCY    EKG   Radiology No results found.  Procedures Procedures (including critical care time)  Medications Ordered in UC Medications - No data to display  Initial Impression / Assessment and Plan / UC Course  I have reviewed the triage vital signs and the nursing notes.  Pertinent labs & imaging results that were available during my care of the patient were reviewed by me and considered in my medical decision making (see chart for details).     UA indicating UTI with moderate leukocytes.  Will treat with Macrobid.  Back pain is upper and is most likely not related to UTI as it is most likely muscular. Patient requesting Diflucan as antibiotics typically give her yeast infection.  Will send 1 pill of Diflucan to take at first sign of vaginal yeast.  Urine culture pending.  Urine pregnancy test negative.  Advised to follow-up if any symptoms persist or worsen.  Advised adequate fluid hydration.  Patient verbalized understanding and was agreeable with plan. Final Clinical Impressions(s) / UC Diagnoses   Final diagnoses:  Acute cystitis with hematuria  Dysuria  Urine pregnancy test negative     Discharge  Instructions      I have prescribed you an antibiotic for UTI.  Urine  culture is pending.  We will call when it results.  Follow-up if any symptoms persist or worsen.    ED Prescriptions     Medication Sig Dispense Auth. Provider   nitrofurantoin, macrocrystal-monohydrate, (MACROBID) 100 MG capsule Take 1 capsule (100 mg total) by mouth 2 (two) times daily. 10 capsule Ervin Knack E, [place]   fluconazole (DIFLUCAN) 150 MG tablet Take 1 tablet (150 mg total) by mouth daily. Take at first sign of vaginal yeast. 1 tablet [place], Acie Fredrickson, [place]      PDMP not reviewed this encounter.   Gustavus Bryant, [place] 08/22/22 1053

## 2022-08-23 LAB — URINE CULTURE: Culture: 20000 — AB

## 2022-08-24 LAB — URINE CULTURE
